# Patient Record
Sex: Female | Born: 1975 | Race: White | Hispanic: Yes | Marital: Married | Smoking: Never smoker
Health system: Southern US, Community
[De-identification: ages and names within clinical notes are randomized; demographics above are authoritative.]

---

## 2002-03-17 ENCOUNTER — Other Ambulatory Visit: Admission: RE | Admit: 2002-03-17 | Discharge: 2002-03-17 | Payer: Self-pay | Admitting: Obstetrics and Gynecology

## 2002-06-20 ENCOUNTER — Inpatient Hospital Stay (HOSPITAL_COMMUNITY): Admission: AD | Admit: 2002-06-20 | Discharge: 2002-06-20 | Payer: Self-pay | Admitting: Obstetrics and Gynecology

## 2002-08-14 ENCOUNTER — Encounter (INDEPENDENT_AMBULATORY_CARE_PROVIDER_SITE_OTHER): Payer: Self-pay

## 2002-08-14 ENCOUNTER — Inpatient Hospital Stay (HOSPITAL_COMMUNITY): Admission: AD | Admit: 2002-08-14 | Discharge: 2002-08-17 | Payer: Self-pay | Admitting: *Deleted

## 2003-09-27 ENCOUNTER — Emergency Department (HOSPITAL_COMMUNITY): Admission: EM | Admit: 2003-09-27 | Discharge: 2003-09-27 | Payer: Self-pay | Admitting: Emergency Medicine

## 2004-10-27 ENCOUNTER — Ambulatory Visit: Payer: Self-pay | Admitting: Family Medicine

## 2004-10-27 ENCOUNTER — Other Ambulatory Visit: Admission: RE | Admit: 2004-10-27 | Discharge: 2004-10-27 | Payer: Self-pay | Admitting: Family Medicine

## 2004-10-27 ENCOUNTER — Encounter (INDEPENDENT_AMBULATORY_CARE_PROVIDER_SITE_OTHER): Payer: Self-pay | Admitting: *Deleted

## 2004-10-27 ENCOUNTER — Encounter (INDEPENDENT_AMBULATORY_CARE_PROVIDER_SITE_OTHER): Payer: Self-pay | Admitting: Specialist

## 2004-11-10 ENCOUNTER — Ambulatory Visit: Payer: Self-pay | Admitting: Family Medicine

## 2006-05-16 ENCOUNTER — Ambulatory Visit: Payer: Self-pay | Admitting: Gynecology

## 2006-05-16 ENCOUNTER — Other Ambulatory Visit: Admission: RE | Admit: 2006-05-16 | Discharge: 2006-05-16 | Payer: Self-pay | Admitting: Obstetrics and Gynecology

## 2006-05-17 ENCOUNTER — Encounter (INDEPENDENT_AMBULATORY_CARE_PROVIDER_SITE_OTHER): Payer: Self-pay | Admitting: *Deleted

## 2007-08-20 ENCOUNTER — Ambulatory Visit: Payer: Self-pay | Admitting: Family Medicine

## 2007-08-26 ENCOUNTER — Ambulatory Visit (HOSPITAL_COMMUNITY): Admission: RE | Admit: 2007-08-26 | Discharge: 2007-08-26 | Payer: Self-pay | Admitting: Family Medicine

## 2008-12-23 IMAGING — US US PELVIS COMPLETE MODIFY
1 series · 14 of 25 positions shown · non-contrast
Comparison: None.

CLINICAL DATA: Right lower quadrant pain/irregular menses.
 TRANSABDOMINAL AND TRANSVAGINAL PELVIC ULTRASOUND ? 08/26/07:
TECHNIQUE: Both transabdominal and transvaginal ultrasound examinations of the pelvis were performed including evaluation of the uterus, ovaries, adnexal regions, and pelvic cul-de-sac.

[Series 1: unknown · 0.32mm/px · 14 of 55 slices shown]
[im 1/55]
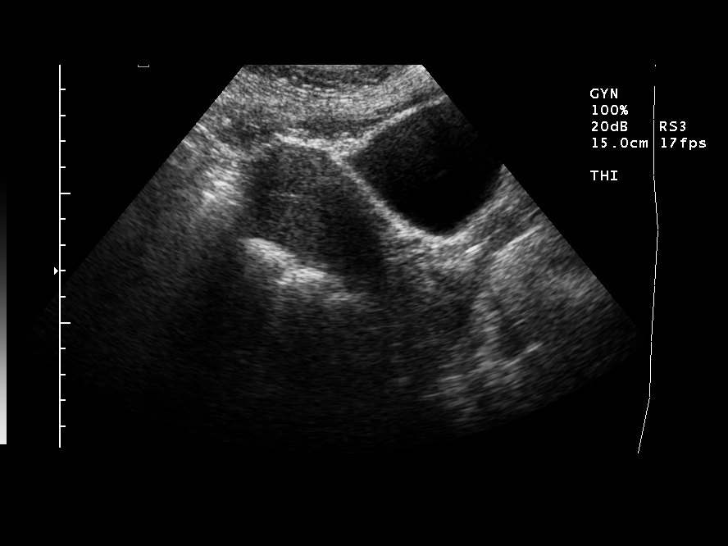
[im 5/55]
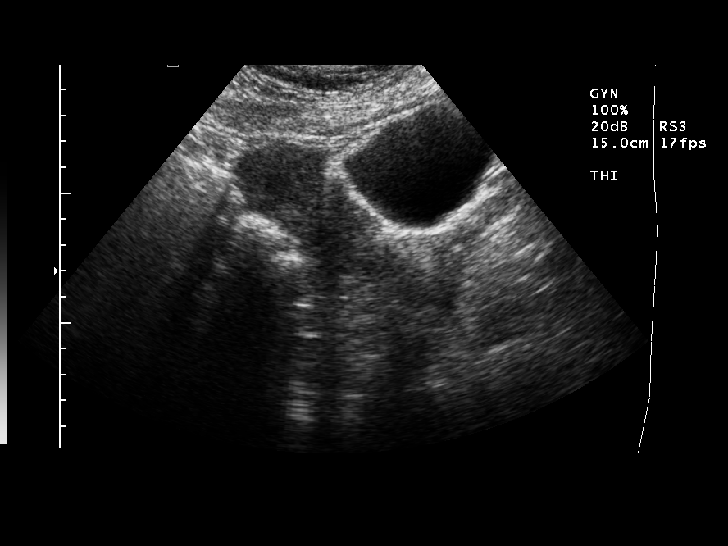
[im 10/55]
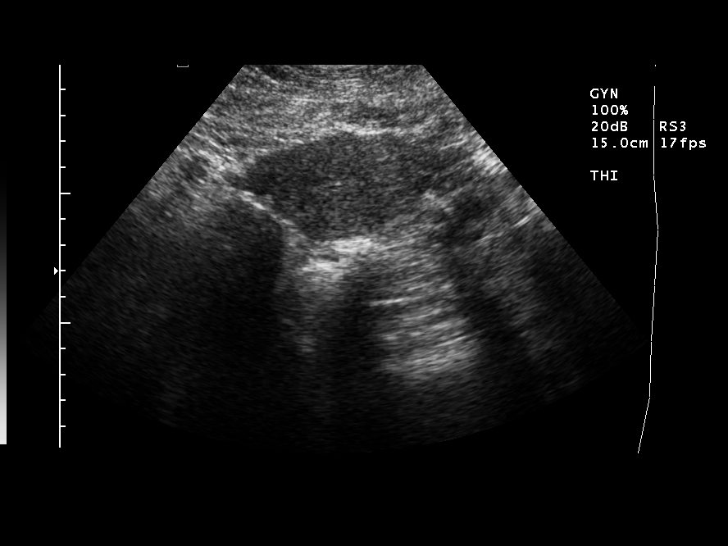
[im 14/55]
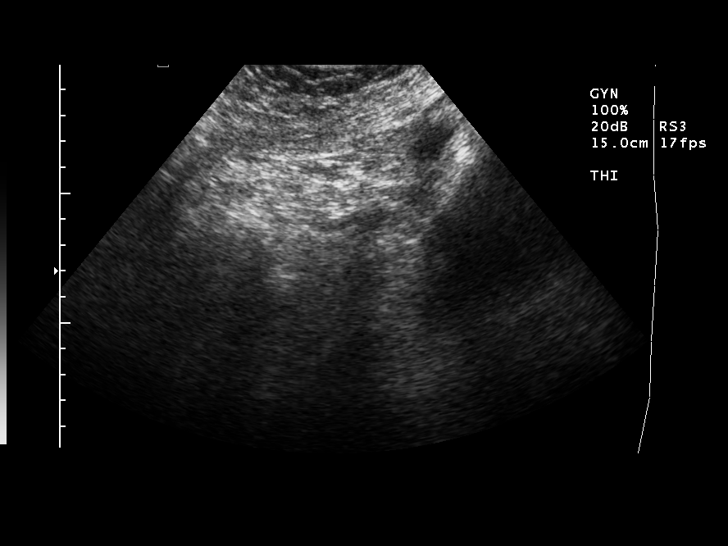
[im 19/55]
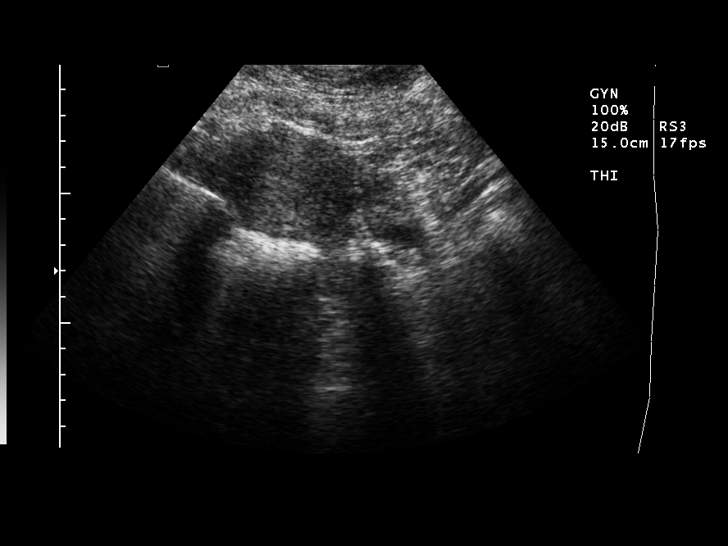
[im 21/55]
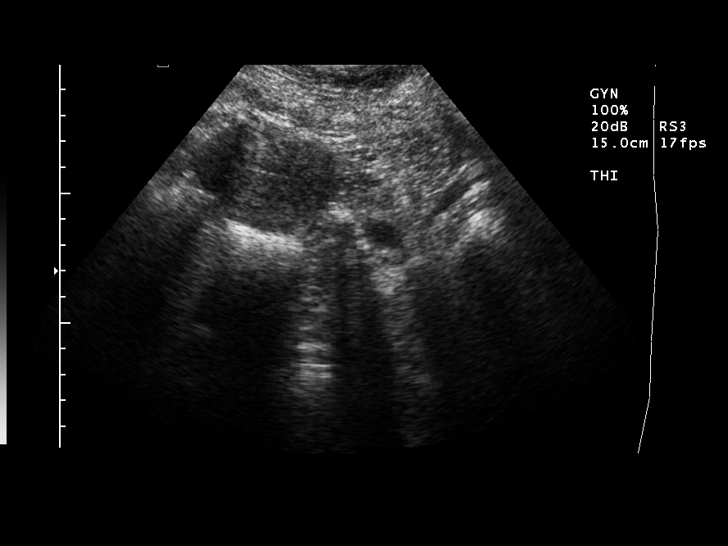
[im 25/55]
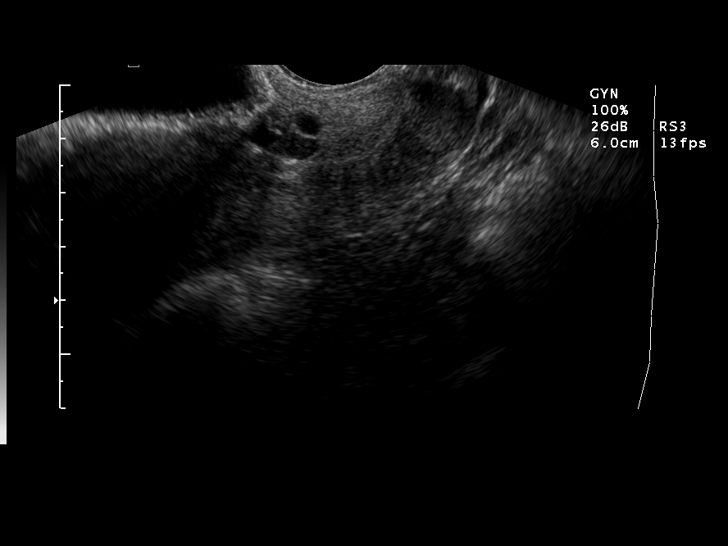
[im 30/55]
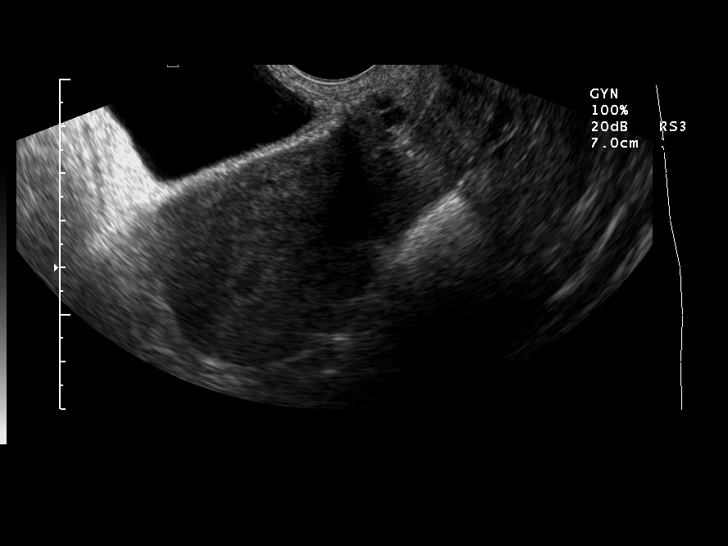
[im 34/55]
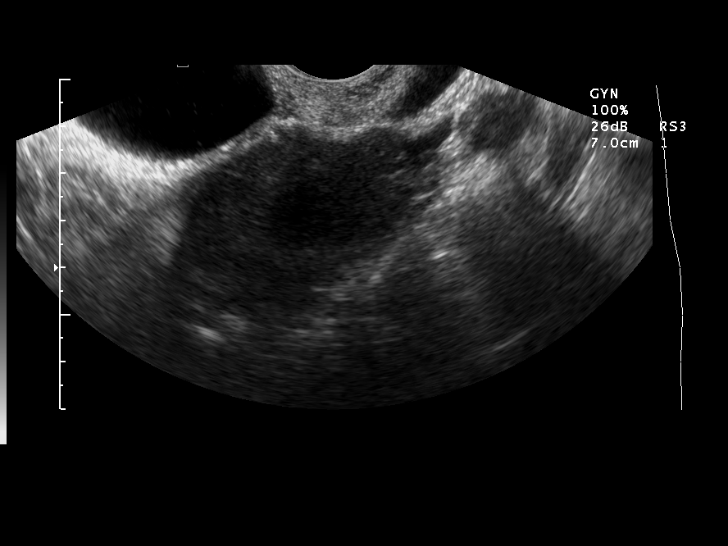
[im 37/55]
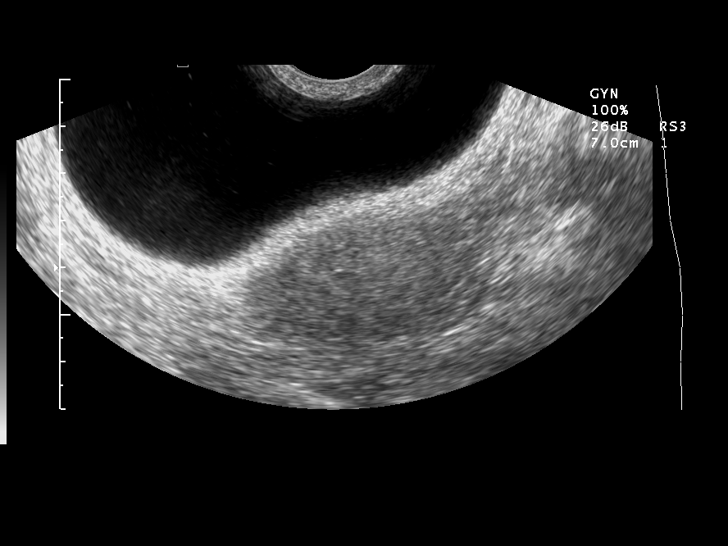
[im 41/55]
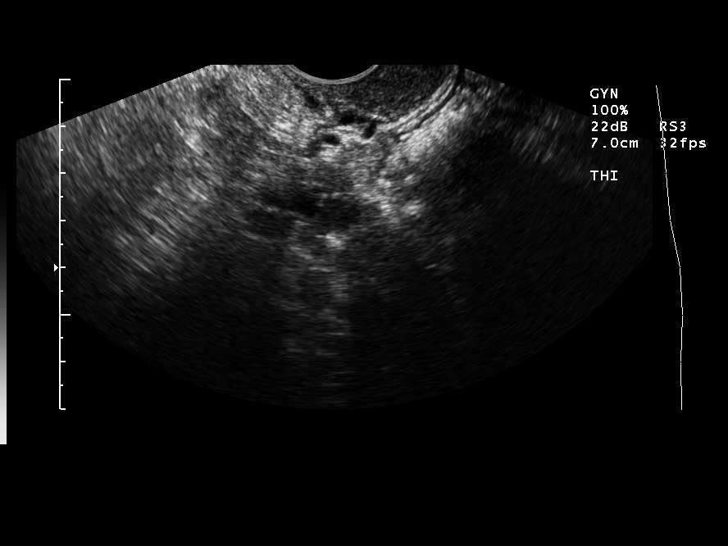
[im 46/55]
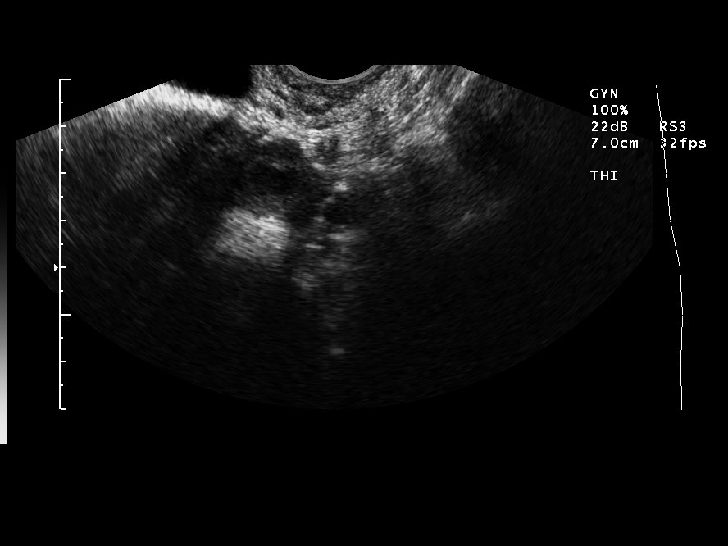
[im 50/55]
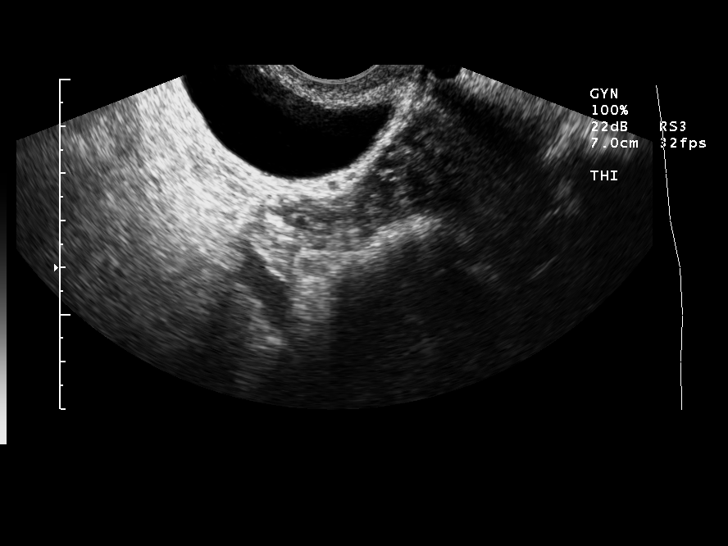
[im 55/55]
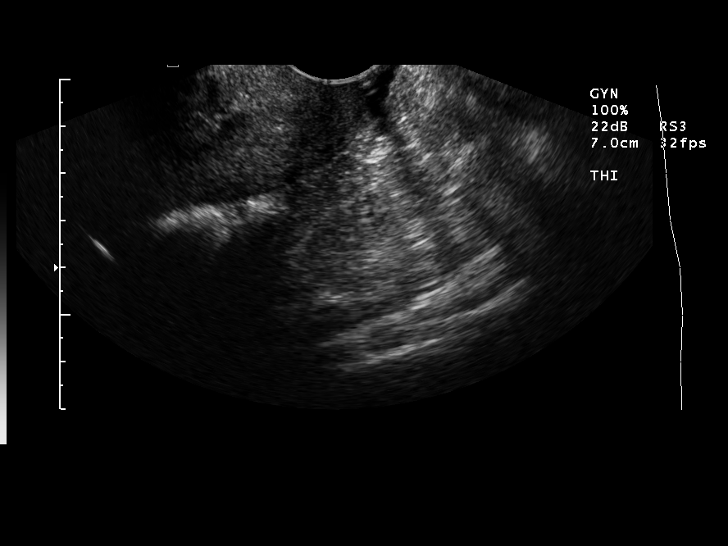

[14 of 25 positions shown; findings below may reference images not displayed]

FINDINGS: All uterine size and contour are within normal limits with measurements of 10.7 x 3.8 x 6.3 cm (length x AP x width).  No lesions of the myometrium.  The endometrium is within normal limits.
 Ovarian size is normal. The right is 2.8 x 1.8 x 2.9 cm.  The left is 2.1 x 1.6 x 2.4 cm.   There are ovarian follicles but no masses or dominant cysts.
 No free pelvic fluid.
IMPRESSION: No pathological findings.

## 2011-04-07 NOTE — Op Note (Signed)
NAMEKADIA, Kristin Mays                          ACCOUNT NO.:  [id number]   MEDICAL RECORD NO.:  [id number]                   PATIENT TYPE:  INP   LOCATION:  9198                                 FACILITY:  [facility]   PHYSICIAN:  Mary Sella. Orlene Erm, M.D.                 DATE OF BIRTH:  [birth year]   DATE OF PROCEDURE:  08/14/2002  DATE OF DISCHARGE:                                 OPERATIVE REPORT   PREOPERATIVE DIAGNOSES:  1. Twin intrauterine pregnancy at term.  2. Active labor.  3. Vertex breech presentation.  4. Undesired fertility.  5. Strongly desires cesarean section.   POSTOPERATIVE DIAGNOSES:  1. Twin intrauterine pregnancy at term.  2. Active labor.  3. Vertex breech presentation.  4. Undesired fertility.  5. Strongly desires cesarean section.   PROCEDURES:  1. Primary low transverse cesarean section.  2. Bilateral tubal ligation.   SURGEON:  Enid Cutter, M.D.   ASSISTANT:  Alvira Philips, M.D.   ANESTHESIA:  Spinal.   COMPLICATIONS:  None.   ESTIMATED BLOOD LOSS:  1000 cc.   DISPOSITION:  Recovery room, stable.   FINDINGS:  Viable female infant delivered at 11:32 a.m. weighing 6 pounds 5  ounces and Apgars of 8 and 9 and a viable female infant delivered at 11:34  a.m. weighing 6 pounds 8 ounces and Apgars of 8 and 9.   INDICATIONS FOR PROCEDURE:  The patient is a 35 year old gravida 4, para 3,  Hispanic female at 38 weeks and 5 days who presents in active labor.  She  presents to the [facility] MAU contracting regularly, 8 cm dilatation.  The patient was found to have twin presentations in vertex and breech  position.  The patient was counseled on vaginal delivery versus cesarean and  also desires to have cesarean section secondary to concerns about the breech  twin and strong desire to have permanent sterilization.  The patient was  counseled on risk of surgery including risk of bleeding, infection, and  injury to internal organs.  In addition, she was  counseled on the risk of  blood transfusion or need for possible emergent hysterectomy.  The patient  was counseled on the risk of bilateral tubal ligation including that it is a  permanent procedure and although it may fail at a rate of 1 per 200 women.  She was counseled that if she did become pregnant she may have an increased  risk of ectopic gestation.  The patient understands these risks and desires  strongly to proceed with primary low transverse cesarean section and  bilateral tubal ligation.   PROCEDURE:  The patient was taken to the operating room where she given  spinal anesthesia.  She was prepped and draped in sterile fashion.  A  Pfannenstiel incision was performed with a scalpel and carried down to the  underlying fascia.  The fascia was entered sharply and dissected laterally  with Mayo scissors.  The fascia was separated from the underlying rectus  muscle bellies with sharp and blunt dissection.  The rectus muscle bellies  were separated and the peritoneum was entered sharply with Metzenbaum  scissors.  The peritoneum was thus lifted superiorly and inferiorly and the  surgeon's hands were used to stretch the peritoneum.  The bladder blade was  placed and a bladder flap was created with sharp and blunt dissection.  The  uterus was then scored with a scalpel and the uterine cavity was entered in  the midline.  Amniotic fluid was clear.  Baby A's head was delivered through  the operative field and bulb suctioned on the operative field.  The infant's  body was delivered without difficulty.  The cord was clamped and cut and  infant handed off to the waiting neonatal resuscitation team.  The second  twin's membranes were intact and the feet were delivered through the  incision.  The amniotic membranes were ruptured and amniotic fluid was  clear.  The infant was delivered through the operative field.  The infant's  left arm was delivered first.  The infant was rotated 180  degrees.  The  right arm was delivered and the head was flexed and delivered without  difficulty.  The cord was clamped and cut and the infant was handed off to  the waiting neonatal resuscitation team.  The placenta was then manually  extracted and the uterus was externalized.  The uterus was curetted with a  dry lap sponge.  The uterine incision was repaired with a running, locking  stitch of 0 chromic.  The second imbricating layer was used to obtain final  hemostasis.  The patient's right fallopian tube was grasped with a Babcock  clamp and ligated with double ties of 0 plain gut.  The patient's other tube  was grabbed with a Babcock clamp and ligated in similar fashion.  One-cm  segments of tube were excised and handed off for pathology.  The uterus was  returned to the abdominal cavity.  The paracolonic gutters were emptied of  clot and debris.  The tubal ligation sites were inspected and noted to be  hemostatic.  The uterine incision was noted to be hemostatic.  The fascia  was then closed with a running suture of 0 Vicryl.  The subcutaneous tissues  were irrigated with copious amounts of saline and the skin edges were  reapproximated with staples.  The patient tolerated the procedure well and  returned to the recovery room in stable condition.  Sponge, instrument, and  needle counts were correct at the end of the procedure.                                                  Mary Sella. Orlene Erm, M.D.    [facility]  D:  08/14/2002  T:  08/14/2002  Job:  [id number]   cc:   Alvira Philips, M.D.  [facility].  Family Prac. Resident  [place]  [place] [address]  Fax: [phone]

## 2011-04-07 NOTE — Discharge Summary (Signed)
Kristin Mays, Kristin Mays                          ACCOUNT NO.:  [id number]   MEDICAL RECORD NO.:  [id number]                   PATIENT TYPE:  INP   LOCATION:  9103                                 FACILITY:  [facility]   PHYSICIAN:  Kristin Mays, M.D.                 DATE OF BIRTH:  Dec 07, 1975   DATE OF ADMISSION:  08/14/2002  DATE OF DISCHARGE:  08/17/2002                                 DISCHARGE SUMMARY   DISCHARGE DIAGNOSES:  1. Intrauterine twin gestation at 41 weeks.  2. Status post cesarean section.  3. Status post bilateral tubal ligation.   DISCHARGE MEDICATIONS:  1. Percocet 5/325 one to two tablets p.o. q.4h. p.r.n. number 12.  2. Ibuprofen 600 mg one tablet q.6h. p.r.n.   FOLLOW UP:  The patient is to follow up in six weeks at [facility].   PROCEDURE:  Low transverse cesarean section and bilateral tubal ligation  performed by Dr. Orlene Mays on August 14, 2002.   PRESENTING HISTORY:  This is a 35 year old G4, P3-0-0-3 who presented at 38  weeks 5 days dated by second trimester ultrasound and LMP who complained of  contractions to the maternity admissions.   PAST OBSTETRICAL HISTORY:  The patient had three prior spontaneous vaginal  deliveries in 1998, 1999, and 2001 all males.  Current gestation is a twin  gestation.   PAST MEDICAL HISTORY:  Nonsignificant.   PAST SURGICAL HISTORY:  Nonsignificant.   PRENATAL LABORATORIES:  Blood type O+.  Antibody negative.  Hemoglobin 11.6,  platelets 280,000.  Rubella immune.  Hepatitis, syphilis, HIV, GC,  Chlamydia, and GBS were all negative.  The patient had an elevated fasting  glucose of 168, but three hour GTT was normal.   PHYSICAL EXAMINATION:  VITAL SIGNS:  Blood pressure 123/71, afebrile.  PELVIC:  Digital cervical examination on presentation:  The patient was 7-8  cm, 90%, and +1.  Bed side ultrasound showed that twin A was vertex and twin  B was breech.  Fetal heart tones for both A and B were 135-145 and  reassuring, good variability, reactive.  Contractions were irregular.   ASSESSMENT:  This was a 35 year old G4, P3-0-0-3 at 23 and 5 who presented  in active labor.  GBS negative.  Twin gestation.  It was mother's request  (mom was Spanish speaking only) for an elective cesarean section and this  request is honored.   HOSPITAL COURSE:  The patient was taken back to the OR for cesarean delivery  of her twins.  For full operative report please see dictation of August 14, 2002.  She gave birth to a viable female 6 pounds 5 ounces, Apgars 8  and 9 and a viable female 6 pounds 8 ounces, Apgars 8 and 9.  The patient had  an uncomplicated postoperative course and was discharged three days  postpartum.  Discharge hemoglobin was 10.7.  The patient was breast-feeding  at time of discharge and advised to take prenatal vitamins during the time  she was breast-feeding.     Bradly Bienenstock, M.D.                         Kristin Mays, M.D.    [name]  D:  08/17/2002  T:  08/18/2002  Job:  [id number]

## 2021-01-20 ENCOUNTER — Ambulatory Visit: Payer: Self-pay | Attending: Critical Care Medicine

## 2021-01-20 DIAGNOSIS — Z23 Encounter for immunization: Secondary | ICD-10-CM

## 2021-01-20 NOTE — Progress Notes (Signed)
   Covid-19 Vaccination Clinic  Name:  Kristin Mays    MRN: [id number] DOB: 01-Mar-1976  01/20/2021  [name] was observed post Covid-19 immunization for 15 minutes without incident. She was provided with Vaccine Information Sheet and instruction to access the V-Safe system.   [name] was instructed to call 911 with any severe reactions post vaccine: Marland Kitchen Difficulty breathing  . Swelling of face and throat  . A fast heartbeat  . A bad rash all over body  . Dizziness and weakness   Immunizations Administered    Name Date Dose VIS Date Route   PFIZER Comrnaty(Gray TOP) Covid-19 Vaccine 01/20/2021 12:32 PM 0.3 mL 10/28/2020 Intramuscular   Manufacturer: [employer], [employer]   Lot: [id number]   NDC: [phone]

## 2021-10-10 ENCOUNTER — Ambulatory Visit (INDEPENDENT_AMBULATORY_CARE_PROVIDER_SITE_OTHER): Payer: Self-pay | Admitting: Nurse Practitioner

## 2021-10-10 ENCOUNTER — Encounter: Payer: Self-pay | Admitting: Nurse Practitioner

## 2021-10-10 ENCOUNTER — Other Ambulatory Visit: Payer: Self-pay

## 2021-10-10 VITALS — BP 145/89 | HR 99 | Temp 98.2°F | Resp 18 | Ht 61.0 in | Wt 122.0 lb

## 2021-10-10 DIAGNOSIS — J069 Acute upper respiratory infection, unspecified: Secondary | ICD-10-CM

## 2021-10-10 MED ORDER — CETIRIZINE HCL 10 MG PO TABS: 10.0000 mg | ORAL_TABLET | Freq: Every day | ORAL | 11 refills | Status: AC

## 2021-10-10 MED ORDER — PSEUDOEPH-BROMPHEN-DM 30-2-10 MG/5ML PO SYRP: 5.0000 mL | ORAL_SOLUTION | Freq: Four times a day (QID) | ORAL | 0 refills | Status: AC | PRN

## 2021-10-10 MED ORDER — GUAIFENESIN ER 600 MG PO TB12: 600.0000 mg | ORAL_TABLET | Freq: Two times a day (BID) | ORAL | 0 refills | Status: AC

## 2021-10-10 MED ORDER — NAPROXEN 500 MG PO TABS: 500.0000 mg | ORAL_TABLET | Freq: Two times a day (BID) | ORAL | 0 refills | Status: AC

## 2021-10-10 NOTE — Patient Instructions (Addendum)
URI:  Will order Bromfed  Will order mucinex  Will order antihistamine  Stay active  Stay well hydrated  Will order naproxen for headache  Follow up:  Follow up if needed

## 2021-10-10 NOTE — Assessment & Plan Note (Signed)
Will order Bromfed  Will order mucinex  Will order antihistamine  Stay active  Stay well hydrated  Will order naproxen for headache  Follow up:  Follow up if needed

## 2021-10-10 NOTE — Progress Notes (Signed)
Patient has taken tylenol at 4am today for HA that has been present since Friday. Patient reports dry nonproductive cough. Patient is experiencing nausea today with no vomiting, no diarrhea and no fevers at home. Patient states last week her daughter had a cough and fever that went away over time. Patient has not eaten today.

## 2021-10-10 NOTE — Progress Notes (Signed)
@  Patient ID: Kristin Mays, female    DOB: [birth year], 45 y.o.   MRN: [id number]  Chief Complaint  Patient presents with   Headache   Cough    Referring provider: No ref. provider found  HPI  Patient presents today for headache, cough, congestion.  She does have a Spanish interpreter for this visit.  She states that she has been experiencing symptoms of postnasal drip, headache, sore throat, cough, head congestion since this past Friday.  She states that her daughter was sick with the same type symptoms last week.  Her daughter was not tested for flu or COVID. Denies f/c/s, n/v/d, hemoptysis, PND, chest pain or edema.   No Known Allergies  Immunization History  Administered Date(s) Administered   PFIZER Comirnaty(Gray Top)Covid-19 Tri-Sucrose Vaccine 01/20/2021    History reviewed. No pertinent past medical history.  Tobacco History: Social History   Tobacco Use  Smoking Status Never  Smokeless Tobacco Never   Counseling given: Not Answered   Outpatient Encounter Medications as of 10/10/2021  Medication Sig   acetaminophen (TYLENOL) 325 MG tablet Take 650 mg by mouth every 6 (six) hours as needed.   brompheniramine-pseudoephedrine-DM 30-2-10 MG/5ML syrup Take 5 mLs by mouth 4 (four) times daily as needed for up to 7 days.   cetirizine (ZYRTEC) 10 MG tablet Take 1 tablet (10 mg total) by mouth daily.   guaiFENesin (MUCINEX) 600 MG 12 hr tablet Take 1 tablet (600 mg total) by mouth 2 (two) times daily for 7 days.   naproxen (NAPROSYN) 500 MG tablet Take 1 tablet (500 mg total) by mouth 2 (two) times daily with a meal for 7 days.   No facility-administered encounter medications on file as of 10/10/2021.     Review of Systems  Review of Systems  Constitutional:  Positive for chills.  HENT:  Positive for congestion, postnasal drip and sore throat.   Respiratory:  Positive for cough.   Cardiovascular: Negative.   Gastrointestinal: Negative.   Allergic/Immunologic:  Negative.   Neurological: Negative.   Psychiatric/Behavioral: Negative.        Physical Exam  BP (!) 145/89 (BP Location: Right Arm, Patient Position: Sitting, Cuff Size: Normal)   Pulse 99   Temp 98.2 F (36.8 C) (Oral)   Resp 18   Ht 5\' 1"  (1.549 m)   Wt 122 lb (55.3 kg)   LMP 10/04/2021   SpO2 98%   BMI 23.05 kg/m   Wt Readings from Last 5 Encounters:  10/10/21 122 lb (55.3 kg)     Physical Exam Vitals and nursing note reviewed.  Constitutional:      General: She is not in acute distress.    Appearance: She is well-developed.  Cardiovascular:     Rate and Rhythm: Normal rate and regular rhythm.  Pulmonary:     Effort: Pulmonary effort is normal.     Breath sounds: Normal breath sounds.  Neurological:     Mental Status: She is alert and oriented to person, place, and time.      Assessment & Plan:   Upper respiratory tract infection Will order Bromfed  Will order mucinex  Will order antihistamine  Stay active  Stay well hydrated  Will order naproxen for headache  Follow up:  Follow up if needed     10/12/21, NP 10/10/2021

## 2021-10-11 LAB — COVID-19, FLU A+B AND RSV
Influenza A, NAA: DETECTED — AB
Influenza B, NAA: NOT DETECTED
RSV, NAA: NOT DETECTED
SARS-CoV-2, NAA: NOT DETECTED

## 2021-10-22 NOTE — Progress Notes (Signed)
Subjective:    Kristin Mays - 45 y.o. female MRN [id number]  Date of birth: Feb 28, 1976  HPI  Kristin Mays is to establish care and annual physical exam.   Current issues and/or concerns: Reports left ear feels clogged. Denies pain and additional red flag symptoms.    ROS per HPI    Health Maintenance:  Health Maintenance Due  Topic Date Due   Hepatitis C Screening  Never done   TETANUS/TDAP  Never done   PAP SMEAR-Modifier  Never done   COVID-19 Vaccine (2 - Pfizer series) 02/10/2021   INFLUENZA VACCINE  Never done   COLONOSCOPY (Pts 45-79yr Insurance coverage will need to be confirmed)  Never done    Past Medical History: Patient Active Problem List   Diagnosis Date Noted   Upper respiratory tract infection 10/10/2021    Social History   reports that she has never smoked. She has never used smokeless tobacco. She reports that she does not currently use alcohol. She reports that she does not use drugs.   Family History  family history is not on file.   Medications: reviewed and updated   Objective:   Physical Exam BP 137/86 (BP Location: Left Arm, Patient Position: Sitting, Cuff Size: Normal)   Pulse 86   Temp 98.6 F (37 C)   Resp 18   Ht 5' 0.98" (1.549 m)   Wt 123 lb (55.8 kg)   LMP 10/04/2021   SpO2 98%   BMI 23.25 kg/m   Physical Exam Exam conducted with a chaperone present.  HENT:     Head: Normocephalic and atraumatic.     Right Ear: Tympanic membrane, ear canal and external ear normal.     Left Ear: Tympanic membrane, ear canal and external ear normal.  Eyes:     Extraocular Movements: Extraocular movements intact.     Conjunctiva/sclera: Conjunctivae normal.     Pupils: Pupils are equal, round, and reactive to light.  Cardiovascular:     Rate and Rhythm: Normal rate and regular rhythm.     Pulses: Normal pulses.     Heart sounds: Normal heart sounds.  Pulmonary:     Effort: Pulmonary effort is normal.     Breath sounds: Normal  breath sounds.  Chest:     Comments: Patient declined exam. Abdominal:     General: Bowel sounds are normal.     Palpations: Abdomen is soft.  Genitourinary:    General: Normal vulva.     Vagina: Normal.     Cervix: Normal.     Uterus: Normal.      Adnexa: Right adnexa normal and left adnexa normal.     Comments: EElmon Else CMA present during exam. Musculoskeletal:        General: Normal range of motion.     Cervical back: Normal range of motion and neck supple.  Skin:    General: Skin is warm and dry.     Capillary Refill: Capillary refill takes less than 2 seconds.  Neurological:     General: No focal deficit present.     Mental Status: She is alert and oriented to person, place, and time.  Psychiatric:        Mood and Affect: Mood normal.        Behavior: Behavior normal.      Assessment & Plan:  1. Encounter to establish care: 2. Annual physical exam: - Counseled on 150 minutes of exercise per week, healthy eating (including decreased daily intake of saturated fats,  cholesterol, added sugars, sodium), STI prevention, routine healthcare maintenance.  3. Screening for metabolic disorder: - WUG89+VQXI to check kidney function, liver function, and electrolyte balance.  - CMP14+EGFR  4. Screening for deficiency anemia: - CBC to screen for anemia. - CBC  5. Diabetes mellitus screening: - Hemoglobin A1c to screen for pre-diabetes/diabetes. - Hemoglobin A1c  6. Screening cholesterol level: - Lipid panel to screen for high cholesterol.  - Lipid panel  7. Thyroid disorder screen: - TSH to check thyroid function.  - TSH  8. Need for hepatitis C screening test: - Hepatitis C antibody to screen for hepatitis C.  - Hepatitis C Antibody  9. Encounter for screening mammogram for malignant neoplasm of breast: - Referral for breast cancer screening by mammogram.  - MM Digital Screening; Future  10. Pap smear for cervical cancer screening: - Cytology - PAP for  cervical cancer screening.  - Cytology - PAP([place])  11. Routine screening for STI (sexually transmitted infection): - Cervicovaginal self-swab to screen for chlamydia, gonorrhea, trichomonas, bacterial vaginitis, and candida vaginitis. - Cervicovaginal ancillary only  12. Encounter for screening for HIV: - HIV antibody to screen for human immunodeficiency virus.  - HIV antibody (with reflex)  13. Screening for colon cancer: - Screening for colon cancer. - Fecal occult blood, imunochemical([facility])  14. Need for Tdap vaccination: - Administered today in office.  - Tdap vaccine greater than or equal to 7yo IM  15. Need for influenza vaccination: - Administered today in office.  - Flu Vaccine QUAD 70moIM (Fluarix, Fluzone & Alfiuria Quad PF)  16. Left non-suppurative otitis media: 17. Clogged ear, left: - Amoxicillin as prescribed.  - Follow-up with primary provider as scheduled. - amoxicillin (AMOXIL) 500 MG capsule; Take 1 capsule (500 mg total) by mouth 3 (three) times daily for 7 days.  Dispense: 21 capsule; Refill: 0  18. Financial difficulties: - Offered patient [facility] financial discount/orange card and blue card application. Counseled patient will need to have an appointment with the financial counselor for processing of documentation. Patient agreeable.      Patient was given clear instructions to go to Emergency Department or return to medical center if symptoms don't improve, worsen, or new problems develop.The patient verbalized understanding.  I discussed the assessment and treatment plan with the patient. The patient was provided an opportunity to ask questions and all were answered. The patient agreed with the plan and demonstrated an understanding of the instructions.   The patient was advised to call back or seek an in-person evaluation if the symptoms worsen or if the condition fails to improve as anticipated.    ADurene Fruits  NP 10/25/2021, 11:35 AM [facility]

## 2021-10-25 ENCOUNTER — Encounter: Payer: Self-pay | Admitting: Family

## 2021-10-25 ENCOUNTER — Other Ambulatory Visit: Payer: Self-pay

## 2021-10-25 ENCOUNTER — Other Ambulatory Visit (HOSPITAL_COMMUNITY)
Admission: RE | Admit: 2021-10-25 | Discharge: 2021-10-25 | Disposition: A | Discharge status: Home / Self Care | Payer: Self-pay | Source: Ambulatory Visit | Attending: Family | Admitting: Family

## 2021-10-25 ENCOUNTER — Ambulatory Visit (INDEPENDENT_AMBULATORY_CARE_PROVIDER_SITE_OTHER): Payer: Self-pay | Admitting: Family

## 2021-10-25 VITALS — BP 137/86 | HR 86 | Temp 98.6°F | Resp 18 | Ht 60.98 in | Wt 123.0 lb

## 2021-10-25 DIAGNOSIS — Z599 Problem related to housing and economic circumstances, unspecified: Secondary | ICD-10-CM

## 2021-10-25 DIAGNOSIS — Z23 Encounter for immunization: Secondary | ICD-10-CM

## 2021-10-25 DIAGNOSIS — Z113 Encounter for screening for infections with a predominantly sexual mode of transmission: Secondary | ICD-10-CM | POA: Insufficient documentation

## 2021-10-25 DIAGNOSIS — H938X2 Other specified disorders of left ear: Secondary | ICD-10-CM

## 2021-10-25 DIAGNOSIS — Z114 Encounter for screening for human immunodeficiency virus [HIV]: Secondary | ICD-10-CM

## 2021-10-25 DIAGNOSIS — Z13228 Encounter for screening for other metabolic disorders: Secondary | ICD-10-CM

## 2021-10-25 DIAGNOSIS — Z124 Encounter for screening for malignant neoplasm of cervix: Secondary | ICD-10-CM

## 2021-10-25 DIAGNOSIS — Z1211 Encounter for screening for malignant neoplasm of colon: Secondary | ICD-10-CM

## 2021-10-25 DIAGNOSIS — Z130 Encounter for screening for diseases of the blood and blood-forming organs and certain disorders involving the immune mechanism: Secondary | ICD-10-CM

## 2021-10-25 DIAGNOSIS — Z0001 Encounter for general adult medical examination with abnormal findings: Secondary | ICD-10-CM

## 2021-10-25 DIAGNOSIS — Z1231 Encounter for screening mammogram for malignant neoplasm of breast: Secondary | ICD-10-CM

## 2021-10-25 DIAGNOSIS — Z13220 Encounter for screening for lipoid disorders: Secondary | ICD-10-CM

## 2021-10-25 DIAGNOSIS — Z0000 Encounter for general adult medical examination without abnormal findings: Secondary | ICD-10-CM

## 2021-10-25 DIAGNOSIS — Z1329 Encounter for screening for other suspected endocrine disorder: Secondary | ICD-10-CM

## 2021-10-25 DIAGNOSIS — Z131 Encounter for screening for diabetes mellitus: Secondary | ICD-10-CM

## 2021-10-25 DIAGNOSIS — H6592 Unspecified nonsuppurative otitis media, left ear: Secondary | ICD-10-CM

## 2021-10-25 DIAGNOSIS — Z7689 Persons encountering health services in other specified circumstances: Secondary | ICD-10-CM

## 2021-10-25 DIAGNOSIS — Z1159 Encounter for screening for other viral diseases: Secondary | ICD-10-CM

## 2021-10-25 MED ORDER — AMOXICILLIN 500 MG PO CAPS
500.0000 mg | ORAL_CAPSULE | Freq: Three times a day (TID) | ORAL | 0 refills | Status: AC
  Filled 2021-10-25: qty 21, 7d supply, fill #0

## 2021-10-25 NOTE — Progress Notes (Signed)
Pt presents to establish care and annual physical exam w/pap desires STD testing Pt states that left ear feels clogged

## 2021-10-25 NOTE — Patient Instructions (Signed)
Cuidados preventivos en las mujeres de 40 a 64 años de edad °Preventive Care 40-45 Years Old, Female °Los cuidados preventivos hacen referencia a las opciones en cuanto al estilo de vida y a las visitas al médico, las cuales pueden promover la salud y el bienestar. Las visitas de cuidado preventivo también se denominan exámenes de bienestar. °¿Qué puedo esperar para mi visita de cuidado preventivo? °Asesoramiento °Su médico puede preguntarle acerca de: °Antecedentes médicos, incluidos los siguientes: °Problemas médicos pasados. °Antecedentes médicos familiares. °Antecedentes de embarazo. °Salud actual, incluido lo siguiente: °Ciclo menstrual. °Métodos anticonceptivos. °Su bienestar emocional. °Bienestar en el hogar y las relaciones personales. °Actividad sexual y salud sexual. °Estilo de vida, incluido lo siguiente: °Consumo de alcohol, nicotina, tabaco o drogas. °Acceso a armas de fuego. °Hábitos de alimentación, ejercicio y sueño. °Su trabajo y ambiente laboral. °Uso de pantalla solar. °Cuestiones de seguridad, como el uso de cinturón de seguridad y casco de bicicleta. °Examen físico °El médico revisará lo siguiente: °Estatura y peso. Estos pueden usarse para calcular el IMC (índice de masa corporal). El IMC es una medición que indica si tiene un peso saludable. °Circunferencia de la cintura. Es una medición alrededor de la cintura. Esta medición también indica si tiene un peso saludable y puede ayudar a predecir su riesgo de padecer ciertas enfermedades, como diabetes tipo 2 y presión arterial alta. °Frecuencia cardíaca y presión arterial. °Temperatura corporal. °Piel para detectar manchas anormales. °¿Qué vacunas necesito? °Las vacunas se aplican a varias edades, según un cronograma. El médico le recomendará vacunas según su edad, sus antecedentes médicos, su estilo de vida y otros factores, como los viajes o el lugar donde trabaja. °¿Qué pruebas necesito? °Pruebas de detección °El médico puede recomendar  pruebas de detección de ciertas afecciones. Esto puede incluir: °Niveles de lípidos y colesterol. °Pruebas de detección de la diabetes. Esto se realiza mediante un control del azúcar en la sangre (glucosa) después de no haber comido durante un periodo de tiempo (ayuno). °Examen pélvico y prueba de Papanicolaou. °Prueba de hepatitis B. °Prueba de hepatitis C. °Prueba del VIH (virus de inmunodeficiencia humana). °Pruebas de infecciones de transmisión sexual (ITS), si está en riesgo. °Pruebas de detección de cáncer de pulmón. °Pruebas de detección de cáncer colorrectal. °Mamografía. Hable con su médico sobre cuándo debe comenzar a realizarse mamografías de manera regular. Esto depende de si tiene antecedentes familiares de cáncer de mama o no. °Pruebas de detección de cáncer relacionado con las mutaciones del BRCA. Es posible que se las deba realizar si tiene antecedentes de cáncer de mama, de ovario, de trompas o peritoneal. °Densitometría ósea. Esto se realiza para detectar osteoporosis. °Hable con su médico sobre los resultados de las pruebas, las opciones de tratamiento y, si corresponde, la necesidad de realizar más pruebas. °Siga estas instrucciones en su casa: °Comida y bebida ° °Siga una dieta que incluya frutas y verduras frescas, cereales integrales, proteínas magras y productos lácteos descremados. °Tome los suplementos vitamínicos y minerales como se lo haya indicado el médico. °No beba alcohol si: °Su médico le indica no hacerlo. °Está embarazada, puede estar embarazada o está tratando de quedar embarazada. °Si bebe alcohol: °Limite la cantidad que consume de 0 a 1 medida por día. °Sepa cuánta cantidad de alcohol hay en las bebidas que toma. En [place], una medida equivale a una botella de cerveza de 12 oz (355 ml), un vaso de vino de 5 oz (148 ml) o un vaso de una bebida alcohólica de alta graduación de 1½ oz (44 ml). °  Estilo de vida °Cepíllese los dientes a la mañana y a la noche con pasta  dental con fluoruro. Use hilo dental una vez al día. °Haga al menos 30 minutos de ejercicio, 5 o más días cada semana. °No consuma ningún producto que contenga nicotina o tabaco. Estos productos incluyen cigarrillos, tabaco para mascar y aparatos de vapeo, como los cigarrillos electrónicos. Si necesita ayuda para dejar de fumar, consulte al médico. °No consuma drogas. °Si es sexualmente activa, practique sexo seguro. Use un condón u otra forma de protección para prevenir las infecciones de transmisión sexual (ITS). °Si no desea quedar embarazada, use un método anticonceptivo. Si busca un embarazo, realice una consulta previa al embarazo con el médico. °Tome aspirina únicamente como se lo haya indicado el médico. Asegúrese de que comprende qué cantidad y cuál presentación debe tomar. Trabaje con el médico para averiguar si es seguro y beneficioso para usted tomar aspirina a diario. °Busque maneras saludables de controlar el estrés, tales como: °Meditación, yoga o escuchar música. °Lleve un diario personal. °Hable con una persona confiable. °Pase tiempo con amigos y familiares. °Minimice la exposición a la radiación UV para reducir el riesgo de cáncer de piel. °Seguridad °Usa siempre el cinturón de seguridad al conducir o viajar en un vehículo. °No conduzca: °Si ha estado bebiendo alcohol. No viaje con un conductor que ha estado bebiendo. °Si está cansada o distraída. °Mientras esté enviando mensajes de texto. °Si ha estado usando sustancias o drogas que alteran la función mental. °Use un casco y otros equipos de protección durante las actividades deportivas. °Si tiene armas de fuego en su casa, asegúrese de seguir todos los procedimientos de seguridad correspondientes. °Busque ayuda si fue víctima de abuso físico o abuso sexual. °¿Cuándo volver? °Visite al médico una vez al año para una visita anual de control de bienestar. °Pregúntele al médico con qué frecuencia debe realizarse un control de la vista y los  dientes. °Mantenga su esquema de vacunación al día. °Esta información no tiene como fin reemplazar el consejo del médico. Asegúrese de hacerle al médico cualquier pregunta que tenga. °Document Revised: 05/26/2021 Document Reviewed: 05/26/2021 °Elsevier Patient Education © 2022 [employer] ° °

## 2021-10-26 ENCOUNTER — Other Ambulatory Visit: Payer: Self-pay

## 2021-10-26 ENCOUNTER — Other Ambulatory Visit: Payer: Self-pay | Admitting: Family

## 2021-10-26 DIAGNOSIS — E119 Type 2 diabetes mellitus without complications: Secondary | ICD-10-CM | POA: Insufficient documentation

## 2021-10-26 LAB — CERVICOVAGINAL ANCILLARY ONLY
Bacterial Vaginitis (gardnerella): NEGATIVE
Candida Glabrata: NEGATIVE
Candida Vaginitis: POSITIVE — AB
Chlamydia: POSITIVE — AB
Comment: NEGATIVE
Comment: NEGATIVE
Comment: NEGATIVE
Comment: NEGATIVE
Comment: NEGATIVE
Comment: NORMAL
Neisseria Gonorrhea: NEGATIVE
Trichomonas: NEGATIVE

## 2021-10-26 LAB — CMP14+EGFR
ALT: 36 IU/L — ABNORMAL HIGH (ref 0–32)
AST: 17 IU/L (ref 0–40)
Albumin/Globulin Ratio: 1.3 (ref 1.2–2.2)
Albumin: 4.3 g/dL (ref 3.8–4.8)
Alkaline Phosphatase: 128 IU/L — ABNORMAL HIGH (ref 44–121)
BUN/Creatinine Ratio: 29 — ABNORMAL HIGH (ref 9–23)
BUN: 13 mg/dL (ref 6–24)
Bilirubin Total: 0.4 mg/dL (ref 0.0–1.2)
CO2: 19 mmol/L — ABNORMAL LOW (ref 20–29)
Calcium: 9.6 mg/dL (ref 8.7–10.2)
Chloride: 101 mmol/L (ref 96–106)
Creatinine, Ser: 0.45 mg/dL — ABNORMAL LOW (ref 0.57–1.00)
Globulin, Total: 3.3 g/dL (ref 1.5–4.5)
Glucose: 157 mg/dL — ABNORMAL HIGH (ref 70–99)
Potassium: 4 mmol/L (ref 3.5–5.2)
Sodium: 135 mmol/L (ref 134–144)
Total Protein: 7.6 g/dL (ref 6.0–8.5)
eGFR: 121 mL/min/{1.73_m2} (ref >59)

## 2021-10-26 LAB — HEMOGLOBIN A1C
Est. average glucose Bld gHb Est-mCnc: 249 mg/dL
Hgb A1c MFr Bld: 10.3 % — ABNORMAL HIGH (ref 4.8–5.6)

## 2021-10-26 LAB — CBC
Hematocrit: 40.6 % (ref 34.0–46.6)
Hemoglobin: 13.7 g/dL (ref 11.1–15.9)
MCH: 30.2 pg (ref 26.6–33.0)
MCHC: 33.7 g/dL (ref 31.5–35.7)
MCV: 89 fL (ref 79–97)
Platelets: 335 10*3/uL (ref 150–450)
RBC: 4.54 x10E6/uL (ref 3.77–5.28)
RDW: 12.7 % (ref 11.7–15.4)
WBC: 9.8 10*3/uL (ref 3.4–10.8)

## 2021-10-26 LAB — LIPID PANEL
Chol/HDL Ratio: 5.3 ratio — ABNORMAL HIGH (ref 0.0–4.4)
Cholesterol, Total: 192 mg/dL (ref 100–199)
HDL: 36 mg/dL — ABNORMAL LOW (ref >39)
LDL Chol Calc (NIH): 115 mg/dL — ABNORMAL HIGH (ref 0–99)
Triglycerides: 233 mg/dL — ABNORMAL HIGH (ref 0–149)
VLDL Cholesterol Cal: 41 mg/dL — ABNORMAL HIGH (ref 5–40)

## 2021-10-26 LAB — HEPATITIS C ANTIBODY: Hep C Virus Ab: 0.2 s/co ratio (ref 0.0–0.9)

## 2021-10-26 LAB — TSH: TSH: 1.28 u[IU]/mL (ref 0.450–4.500)

## 2021-10-26 LAB — HIV ANTIBODY (ROUTINE TESTING W REFLEX): HIV Screen 4th Generation wRfx: NONREACTIVE

## 2021-10-26 MED ORDER — INSULIN GLARGINE 100 UNIT/ML SOLOSTAR PEN
10.0000 [IU] | PEN_INJECTOR | Freq: Every day | SUBCUTANEOUS | 0 refills | Status: AC
  Filled 2021-10-26: qty 3, 30d supply, fill #0

## 2021-10-26 MED ORDER — METFORMIN HCL 500 MG PO TABS
500.0000 mg | ORAL_TABLET | Freq: Two times a day (BID) | ORAL | 0 refills | Status: AC
  Filled 2021-10-26: qty 60, 30d supply, fill #0

## 2021-10-26 MED ORDER — INSULIN PEN NEEDLE 31G X 8 MM MISC
0 refills | Status: AC
  Filled 2021-10-26: qty 100, 30d supply, fill #0

## 2021-10-26 NOTE — Progress Notes (Signed)
Please call patient with update.   Kidney function normal.   Thyroid function normal.  No anemia.  Hepatitis C negative.   HIV negative.  Liver function mildly elevated. Encouraged to recheck in 4 to 6 weeks at lab only appointment.   Your hemoglobin A1c is consistent with diabetes. Practice healthy eating habits of fresh fruit and vegetables, lean baked meats such as chicken, fish, and [place]; limit breads, rice, pastas, and desserts; practice regular aerobic exercise (at least 150 minutes a week as tolerated).  Begin Metformin and insulin as prescribed for diabetes. Follow-up with primary provider in 4 weeks or sooner if needed (please schedule while on phone).   Cholesterol higher than expected. High cholesterol may increase risk of heart attack and/or stroke. Consider eating more fruits, vegetables, and lean baked meats such as chicken or fish. Moderate intensity exercise at least 150 minutes as tolerated per week may help as well. No medication needed as of present. Encouraged to recheck cholesterol in 3 months.  The following is for provider reference only: The 10-year ASCVD risk score (Arnett DK, et al., 2019) is: 1.7%   Values used to calculate the score:     Age: 49 years     Sex: Female     Is Non-Hispanic African American: No     Diabetic: No     Tobacco smoker: No     Systolic Blood Pressure: 137 mmHg     Is BP treated: No     HDL Cholesterol: 36 mg/dL     Total Cholesterol: 192 mg/dL

## 2021-10-27 ENCOUNTER — Other Ambulatory Visit: Payer: Self-pay

## 2021-10-27 ENCOUNTER — Other Ambulatory Visit: Payer: Self-pay | Admitting: Family

## 2021-10-27 DIAGNOSIS — A749 Chlamydial infection, unspecified: Secondary | ICD-10-CM | POA: Insufficient documentation

## 2021-10-27 DIAGNOSIS — B3731 Acute candidiasis of vulva and vagina: Secondary | ICD-10-CM

## 2021-10-27 LAB — CYTOLOGY - PAP
Comment: NEGATIVE
Diagnosis: NEGATIVE
High risk HPV: NEGATIVE

## 2021-10-27 MED ORDER — FLUCONAZOLE 150 MG PO TABS
150.0000 mg | ORAL_TABLET | Freq: Once | ORAL | 0 refills | Status: AC
  Filled 2021-10-27: qty 1, 1d supply, fill #0

## 2021-10-27 MED ORDER — DOXYCYCLINE HYCLATE 100 MG PO TABS
100.0000 mg | ORAL_TABLET | Freq: Two times a day (BID) | ORAL | 0 refills | Status: AC
  Filled 2021-10-27: qty 14, 7d supply, fill #0

## 2021-10-27 NOTE — Progress Notes (Signed)
Please call patient with update.   PAP with no lesion or malignancy. HPV negative. Repeat PAP in 3 years or sooner if needed.

## 2021-10-27 NOTE — Progress Notes (Signed)
Please call patient with update.   Bacterial Vaginitis, Gonorrhea, and Trichomonas negative.  Positive for Candida Vaginitis which is better known as a yeast infection. Prescribed Fluconazole (Diflucan) 150 mg (1 tablet) for a one time dose to treat this.  Positive for Chlamydia which is a sexually transmitted infection. Prescribed Doxycycline (Vibra-Tabs). Both patient and her partner need to be treated for Chlamydia. Also, both need to complete treatment prior to having unprotected sex again. Do not cosume alcohol while taking prescribed antibiotic as this may causes severe stomach pain, nausea, and vomiting.

## 2022-10-18 ENCOUNTER — Ambulatory Visit: Payer: Self-pay

## 2022-10-18 DIAGNOSIS — Z23 Encounter for immunization: Secondary | ICD-10-CM

## 2023-02-13 ENCOUNTER — Ambulatory Visit (INDEPENDENT_AMBULATORY_CARE_PROVIDER_SITE_OTHER): Payer: Self-pay | Admitting: Family

## 2023-02-13 VITALS — BP 118/64 | HR 90 | Temp 98.3°F | Resp 16 | Ht 60.98 in | Wt 120.0 lb

## 2023-02-13 DIAGNOSIS — R6889 Other general symptoms and signs: Secondary | ICD-10-CM

## 2023-02-13 DIAGNOSIS — R519 Headache, unspecified: Secondary | ICD-10-CM

## 2023-02-13 MED ORDER — SUMATRIPTAN SUCCINATE 25 MG PO TABS: ORAL_TABLET | ORAL | 1 refills | Status: AC

## 2023-02-13 NOTE — Progress Notes (Signed)
Patient ID: Kristin Mays, female    DOB: [birth year]  MRN: [id number]  CC: Headache  Subjective: Kristin Mays is a 47 y.o. female who presents for headache. She is accompanied by her son, Kristin Mays.  Her concerns today include:  - Intermittent headache x 7 days. She denies red flag symptoms. She is taking over-the-counter Advil with no relief. She does not have a history of headaches/migraines.  - Reports body aches, chills and nausea for several days. She denies associated red flag symptoms.  - No further issues/concerns for discussion today.  Patient Active Problem List   Diagnosis Date Noted   Chlamydia 10/27/2021   Candida vaginitis 10/27/2021   Diabetes mellitus ([place]) 10/26/2021   Upper respiratory tract infection 10/10/2021     Current Outpatient Medications on File Prior to Visit  Medication Sig Dispense Refill   acetaminophen (TYLENOL) 325 MG tablet Take 650 mg by mouth every 6 (six) hours as needed.     cetirizine (ZYRTEC) 10 MG tablet Take 1 tablet (10 mg total) by mouth daily. 30 tablet 11   insulin glargine (LANTUS) 100 UNIT/ML Solostar Pen Inject 10 Units into the skin at bedtime. 3 mL 0   Insulin Pen Needle 31G X 8 MM MISC use as directed 100 each 0   metFORMIN (GLUCOPHAGE) 500 MG tablet Take 1 tablet (500 mg total) by mouth 2 (two) times daily with a meal. 60 tablet 0   No current facility-administered medications on file prior to visit.    No Known Allergies  Social History   Socioeconomic History   Marital status: Married    Spouse name: Not on file   Number of children: Not on file   Years of education: Not on file   Highest education level: Not on file  Occupational History   Not on file  Tobacco Use   Smoking status: Never   Smokeless tobacco: Never  Substance and Sexual Activity   Alcohol use: Not Currently   Drug use: Never   Sexual activity: Not Currently  Other Topics Concern   Not on file  Social History Narrative   Not on file    Social Determinants of Health   Financial Resource Strain: Not on file  Food Insecurity: Not on file  Transportation Needs: Not on file  Physical Activity: Not on file  Stress: Not on file  Social Connections: Not on file  Intimate Partner Violence: Not on file    No family history on file.  No past surgical history on file.  ROS: Review of Systems Negative except as stated above  PHYSICAL EXAM: BP 118/64 (BP Location: Left Arm, Patient Position: Sitting, Cuff Size: Normal)   Pulse 90   Temp 98.3 F (36.8 C)   Resp 16   Ht 5' 0.98" (1.549 m)   Wt 120 lb (54.4 kg)   SpO2 94%   BMI 22.69 kg/m   Physical Exam HENT:     Head: Normocephalic and atraumatic.     Mouth/Throat:     Mouth: Mucous membranes are moist.     Pharynx: Oropharynx is clear.  Eyes:     Extraocular Movements: Extraocular movements intact.     Pupils: Pupils are equal, round, and reactive to light.  Cardiovascular:     Rate and Rhythm: Normal rate and regular rhythm.     Heart sounds: Normal heart sounds.  Pulmonary:     Effort: Pulmonary effort is normal.     Breath sounds: Normal breath sounds.  Musculoskeletal:  Cervical back: Normal range of motion and neck supple.  Neurological:     General: No focal deficit present.     Mental Status: She is alert and oriented to person, place, and time.  Psychiatric:        Mood and Affect: Mood normal.        Behavior: Behavior normal.     ASSESSMENT AND PLAN: 1. Acute nonintractable headache, unspecified headache type - Sumatriptan as prescribed. Counseled on medication adherence/adverse effects.  - Follow-up with primary provider in 4 weeks or sooner if needed.  - SUMAtriptan (IMITREX) 25 MG tablet; Take 25 mg (1 tablet total) by mouth at the start of the headache. May repeat in 2 hours x 1 if headache persists. Max of 2 tablets/24 hours.  Dispense: 30 tablet; Refill: 1  2. Flu-like symptoms - Routine screening.  - COVID-19, Flu A+B and  RSV   Patient was given the opportunity to ask questions.  Patient verbalized understanding of the plan and was able to repeat key elements of the plan. Patient was given clear instructions to go to Emergency Department or return to medical center if symptoms don't improve, worsen, or new problems develop.The patient verbalized understanding.   Orders Placed This Encounter  Procedures   COVID-19, Flu A+B and RSV    Requested Prescriptions   Signed Prescriptions Disp Refills   SUMAtriptan (IMITREX) 25 MG tablet 30 tablet 1    Sig: Take 25 mg (1 tablet total) by mouth at the start of the headache. May repeat in 2 hours x 1 if headache persists. Max of 2 tablets/24 hours.    Return in about 4 weeks (around 03/13/2023) for Follow-Up or next available headache .  Camillia Herter, NP

## 2023-02-13 NOTE — Progress Notes (Signed)
Pt presents for headache accompanied by son Lahoma Crocker interpreting visit  -symptoms include body aches, chills and nausea -started about week ago

## 2023-02-14 LAB — COVID-19, FLU A+B AND RSV
Influenza A, NAA: NOT DETECTED
Influenza B, NAA: NOT DETECTED
RSV, NAA: NOT DETECTED
SARS-CoV-2, NAA: NOT DETECTED

## 2023-03-05 NOTE — Progress Notes (Signed)
Erroneous encounter-disregard

## 2023-03-13 ENCOUNTER — Encounter: Payer: Self-pay | Admitting: Family

## 2023-03-13 DIAGNOSIS — E119 Type 2 diabetes mellitus without complications: Secondary | ICD-10-CM

## 2023-03-13 DIAGNOSIS — Z603 Acculturation difficulty: Secondary | ICD-10-CM

## 2023-03-13 DIAGNOSIS — R519 Headache, unspecified: Secondary | ICD-10-CM

## 2023-03-13 DIAGNOSIS — Z13220 Encounter for screening for lipoid disorders: Secondary | ICD-10-CM

## 2023-12-04 ENCOUNTER — Other Ambulatory Visit: Payer: Self-pay
